# Patient Record
Sex: Male | Born: 1990 | Race: White | Hispanic: No | Marital: Single | Smoking: Current every day smoker
Health system: Southern US, Community
[De-identification: ages and names within clinical notes are randomized; demographics above are authoritative.]

## PROBLEM LIST (undated history)

## (undated) DIAGNOSIS — K279 Peptic ulcer, site unspecified, unspecified as acute or chronic, without hemorrhage or perforation: Secondary | ICD-10-CM

---

## 2016-09-06 ENCOUNTER — Emergency Department (HOSPITAL_COMMUNITY): Payer: Non-veteran care

## 2016-09-06 ENCOUNTER — Emergency Department (HOSPITAL_COMMUNITY)
Admission: EM | Admit: 2016-09-06 | Discharge: 2016-09-06 | Disposition: A | Discharge status: Home / Self Care | Payer: Non-veteran care | Attending: Emergency Medicine | Admitting: Emergency Medicine

## 2016-09-06 ENCOUNTER — Encounter (HOSPITAL_COMMUNITY): Payer: Self-pay | Admitting: *Deleted

## 2016-09-06 DIAGNOSIS — R21 Rash and other nonspecific skin eruption: Secondary | ICD-10-CM | POA: Diagnosis not present

## 2016-09-06 DIAGNOSIS — N50811 Right testicular pain: Secondary | ICD-10-CM

## 2016-09-06 DIAGNOSIS — Z202 Contact with and (suspected) exposure to infections with a predominantly sexual mode of transmission: Secondary | ICD-10-CM | POA: Diagnosis not present

## 2016-09-06 DIAGNOSIS — R6889 Other general symptoms and signs: Secondary | ICD-10-CM

## 2016-09-06 DIAGNOSIS — R05 Cough: Secondary | ICD-10-CM | POA: Diagnosis not present

## 2016-09-06 DIAGNOSIS — R1084 Generalized abdominal pain: Secondary | ICD-10-CM | POA: Diagnosis present

## 2016-09-06 DIAGNOSIS — F17200 Nicotine dependence, unspecified, uncomplicated: Secondary | ICD-10-CM | POA: Insufficient documentation

## 2016-09-06 DIAGNOSIS — R059 Cough, unspecified: Secondary | ICD-10-CM

## 2016-09-06 HISTORY — DX: Peptic ulcer, site unspecified, unspecified as acute or chronic, without hemorrhage or perforation: K27.9

## 2016-09-06 LAB — COMPREHENSIVE METABOLIC PANEL
ALBUMIN: 4.3 g/dL (ref 3.5–5.0)
ALT: 20 U/L (ref 17–63)
AST: 25 U/L (ref 15–41)
Alkaline Phosphatase: 70 U/L (ref 38–126)
Anion gap: 6 (ref 5–15)
BILIRUBIN TOTAL: 0.7 mg/dL (ref 0.3–1.2)
BUN: 8 mg/dL (ref 6–20)
CO2: 23 mmol/L (ref 22–32)
CREATININE: 1.11 mg/dL (ref 0.61–1.24)
Calcium: 9 mg/dL (ref 8.9–10.3)
Chloride: 106 mmol/L (ref 101–111)
GFR calc Af Amer: >60 mL/min (ref >60)
GFR calc non Af Amer: >60 mL/min (ref >60)
GLUCOSE: 100 mg/dL — AB (ref 65–99)
POTASSIUM: 4.2 mmol/L (ref 3.5–5.1)
Sodium: 135 mmol/L (ref 135–145)
TOTAL PROTEIN: 7.1 g/dL (ref 6.5–8.1)

## 2016-09-06 LAB — URINALYSIS, ROUTINE W REFLEX MICROSCOPIC
BILIRUBIN URINE: NEGATIVE
Glucose, UA: NEGATIVE mg/dL
Hgb urine dipstick: NEGATIVE
KETONES UR: NEGATIVE mg/dL
Leukocytes, UA: NEGATIVE
NITRITE: NEGATIVE
PH: 7 (ref 5.0–8.0)
Protein, ur: NEGATIVE mg/dL
Specific Gravity, Urine: 1.018 (ref 1.005–1.030)

## 2016-09-06 LAB — CBC
HEMATOCRIT: 42.3 % (ref 39.0–52.0)
Hemoglobin: 14.7 g/dL (ref 13.0–17.0)
MCH: 30.2 pg (ref 26.0–34.0)
MCHC: 34.8 g/dL (ref 30.0–36.0)
MCV: 87 fL (ref 78.0–100.0)
Platelets: 153 10*3/uL (ref 150–400)
RBC: 4.86 MIL/uL (ref 4.22–5.81)
RDW: 12.5 % (ref 11.5–15.5)
WBC: 6.6 10*3/uL (ref 4.0–10.5)

## 2016-09-06 LAB — LIPASE, BLOOD: Lipase: 21 U/L (ref 11–51)

## 2016-09-06 MED ORDER — TRIAMCINOLONE ACETONIDE 0.1 % EX CREA: 1.0000 "application " | TOPICAL_CREAM | Freq: Two times a day (BID) | CUTANEOUS | 0 refills | Status: AC

## 2016-09-06 MED ORDER — AZITHROMYCIN 250 MG PO TABS
1000.0000 mg | ORAL_TABLET | Freq: Once | ORAL | Status: AC
  Administered 2016-09-06: 1000 mg via ORAL
  Filled 2016-09-06: qty 4

## 2016-09-06 MED ORDER — LIDOCAINE HCL (PF) 1 % IJ SOLN
INTRAMUSCULAR | Status: AC
  Administered 2016-09-06: 5 mL
  Filled 2016-09-06: qty 5

## 2016-09-06 MED ORDER — CEFTRIAXONE SODIUM 1 G IJ SOLR
1.0000 g | INTRAMUSCULAR | Status: DC
  Administered 2016-09-06: 1 g via INTRAMUSCULAR
  Filled 2016-09-06: qty 10

## 2016-09-06 NOTE — ED Notes (Signed)
Patient transported to Ultrasound 

## 2016-09-06 NOTE — Discharge Instructions (Signed)
As we discussed, your ultrasound did show a cyst in your testicle but this is benign.  You were treated for gonorrhea and chlamydia, your cultures should come back in the next few days and we will call you if they are abnormal. Follow-up with urology if any ongoing issues with testicle pain, swelling, etc. Use kenalog for rash. Return to the ED for new or worsening symptoms.

## 2016-09-06 NOTE — ED Triage Notes (Signed)
Pt states that he has abdominal pain, cough/congestion, fatigue since the weekend. Pt states that he also needs to be checked for gonorrhea.

## 2016-09-06 NOTE — ED Notes (Signed)
Pt reports that his ex has an std, denies any symptoms

## 2016-09-06 NOTE — ED Provider Notes (Signed)
[facility]-EMERGENCY DEPT Provider Note   CSN: [id number] Arrival date & time: 09/06/16  1318  By signing my name below, I, Rosario Adie, attest that this documentation has been prepared under the direction and in the presence of Sharilyn Sites, PA-C.  Electronically Signed: Rosario Adie, ED Scribe. 09/06/16. 4:23 PM.  History   Chief Complaint Chief Complaint  Patient presents with  . Abdominal Pain   The history is provided by the patient. No language interpreter was used.    HPI Comments: Donald Young is a 26 y.o. male who presents to the Emergency Department complaining of persistent generalized abdominal pain beginning 3-4 days ago. He mentions that over the past several days since the onset of his abdominal pain that he has also experience some cough, congestion, and mild fatigue. No noted treatments for his symptoms were tried prior to coming into the ED. He has been able to tolerate PO intake since the onset of his symptoms. No nausea, diarrhea, vomiting, loss of appetite, fever.  He is also c/o a rash to the b/l hand and feet. He reports that he recently changed soaps just prior to the onset of his rash (was using old spice, now using dove).  state rash is pruritic. No fever, trouble swallowing, shortness of breath.   He additionally mentions that one of his recent sexual partners called him recently and notified him that she tested positive for Gonorrhea. He is requesting testing for this while in the ED. Also, he reports that he was recently treated for epididymitis which he was on a course of Doxycycline for. He reports that he was originally dx'd with this one year ago; however, following finishing treatment he has had intermittent and mild pain/swelling to the right testicle since. He did not have an US of the area to confirm this, per pt. He states that this pain is typically worse with movement and activity. No penile discharge, dysuria, hematuria, or other associated  GU symptoms.  Past Medical History:  Diagnosis Date  . Peptic ulcer    There are no active problems to display for this patient.  History reviewed. No pertinent surgical history.  Home Medications    Prior to Admission medications   Not on File   Family History No family history on file.  Social History Social History  Substance Use Topics  . Smoking status: Current Every Day Smoker  . Smokeless tobacco: Not on file  . Alcohol use Not on file   Allergies   Patient has no known allergies.  Review of Systems Review of Systems  Constitutional: Negative for appetite change and fever.  HENT: Positive for congestion. Negative for trouble swallowing.   Respiratory: Positive for cough. Negative for shortness of breath.   Gastrointestinal: Positive for abdominal pain. Negative for diarrhea, nausea and vomiting.  Genitourinary: Positive for scrotal swelling and testicular pain. Negative for dysuria and hematuria.  Skin: Positive for rash.  All other systems reviewed and are negative.  Physical Exam Updated Vital Signs BP 126/76 (BP Location: Left Arm)   Pulse 90   Temp 98 F (36.7 C) (Oral)   Resp 20   SpO2 97%   Physical Exam  Constitutional: He appears well-developed and well-nourished. No distress.  HENT:  Head: Normocephalic and atraumatic.  Eyes: Conjunctivae are normal.  Neck: Normal range of motion.  Cardiovascular: Normal rate, regular rhythm and normal heart sounds.   No murmur heard. Pulmonary/Chest: Effort normal and breath sounds normal. No respiratory distress. He has  no wheezes.  Abdominal: Soft. Bowel sounds are normal. He exhibits no distension. There is no tenderness.  Genitourinary:  Genitourinary Comments: Chaperone present throughout entire exam. Right testicle with some mild tenderness along the medial aspect, there does feel to be a small cystic structure within but no discrete mass.   Musculoskeletal: Normal range of motion.  Neurological: He  is alert.  Skin: No pallor.  Maculopapular rash on dorsal hands and feet, concentrated in the web spaces. There are no lesions on the palms or soles. Area appears dry and pruritic.   Psychiatric: He has a normal mood and affect. His behavior is normal.  Nursing note and vitals reviewed.  ED Treatments / Results  DIAGNOSTIC STUDIES: Oxygen Saturation is 97% on RA, normal by my interpretation.   COORDINATION OF CARE: 4:23 PM-Discussed next steps with pt. Pt verbalized understanding and is agreeable with the plan.   Labs (all labs ordered are listed, but only abnormal results are displayed) Labs Reviewed  COMPREHENSIVE METABOLIC PANEL - Abnormal; Notable for the following:       Result Value   Glucose, Bld 100 (*)    All other components within normal limits  LIPASE, BLOOD  CBC  URINALYSIS, ROUTINE W REFLEX MICROSCOPIC   EKG  EKG Interpretation None      Radiology No results found.  Procedures Procedures   Medications Ordered in ED Medications  cefTRIAXone (ROCEPHIN) injection 1 g (not administered)  azithromycin (ZITHROMAX) tablet 1,000 mg (not administered)   Initial Impression / Assessment and Plan / ED Course  I have reviewed the triage vital signs and the nursing notes.  Pertinent labs & imaging results that were available during my care of the patient were reviewed by me and considered in my medical decision making (see chart for details).  26 year old male here with multiple complaints.  1.  Abdominal pain. Has been ongoing for a few days. Reports associated cough, nasal congestion, mild fatigue. Has been tolerating oral food and fluids well. Abdomen is soft and benign here. Labs overall reassuring. Lungs clear without wheezes or rhonchi to suggest pneumonia.  Suspect this may be a viral process.  Continue supportive care.  2.  Rash.  Localized to dorsal hands and feet, concentrated in the web spaces. No apparent bug bites. Rash occurred after changing soap.  Appears to be a contact dermatitis. No lesions on palms/soles suggest of syphilis.  Will start on Kenalog.  3.  Exposure to gonorrhea. Is not having any discharge but has been having some testicle pain. Reported he was told he had epididymitis but never had ultrasound. On genital exam there is a small cystic structure noted in the right testicle but no discrete mass. Ultrasound was obtained revealing tubular ectasia up the epididymitis, more pronounced on right. UA without signs of infection. Gonorrhea and Chlamydia cultures pending. Patient treated empirically here with Rocephin and azithromycin. Will get follow-up with urology if any ongoing issues.  Discussed plan with patient, he acknowledged understanding and agreed with plan of care.  Return precautions given for new or worsening symptoms.  Final Clinical Impressions(s) / ED Diagnoses   Final diagnoses:  Pain in right testicle  Exposure to gonorrhea  Cough  Rash  Multiple complaints   New Prescriptions Discharge Medication List as of 09/06/2016  6:24 PM    START taking these medications   Details  triamcinolone cream (KENALOG) 0.1 % Apply 1 application topically 2 (two) times daily., Starting Tue 09/06/2016, Print  I personally performed the services described in this documentation, which was scribed in my presence. The recorded information has been reviewed and is accurate.    Garlon HatchetSanders, Abagael Kramm M, PA-C 09/06/16 1901    Tilden Fossaees, Elizabeth, MD 09/07/16 1038

## 2016-09-06 NOTE — ED Notes (Signed)
Patient returned from ultrasound.

## 2018-08-08 IMAGING — US US ART/VEN ABD/PELV/SCROTUM DOPPLER LTD
1 series · 13 of 25 positions shown · non-contrast
Comparison: None.

CLINICAL DATA: RIGHT testicular pain for 1 month

EXAM:
SCROTAL ULTRASOUND
DOPPLER ULTRASOUND OF THE TESTICLES
TECHNIQUE: Complete ultrasound examination of the testicles, epididymis, and
other scrotal structures was performed. Color and spectral Doppler
ultrasound were also utilized to evaluate blood flow to the
testicles.

[Series 1: us art/ven abd/pelv/scrotum doppler ltd · 0.07mm/px · 13 of 70 slices shown]
[im 1/70]
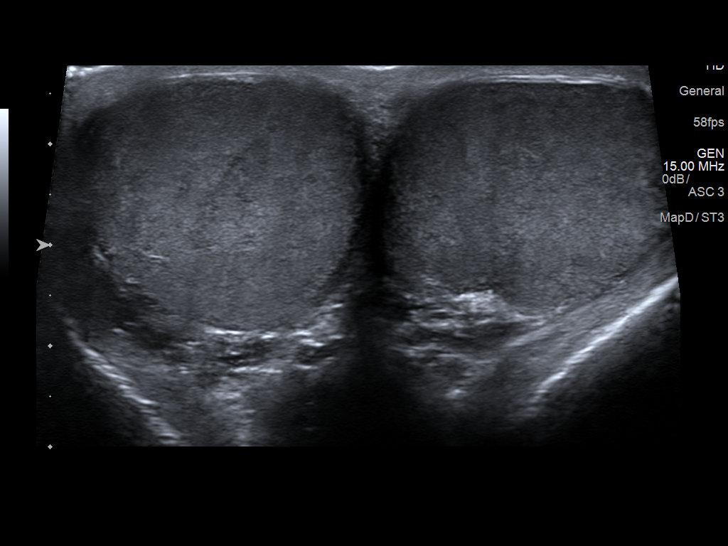
[im 6/70]
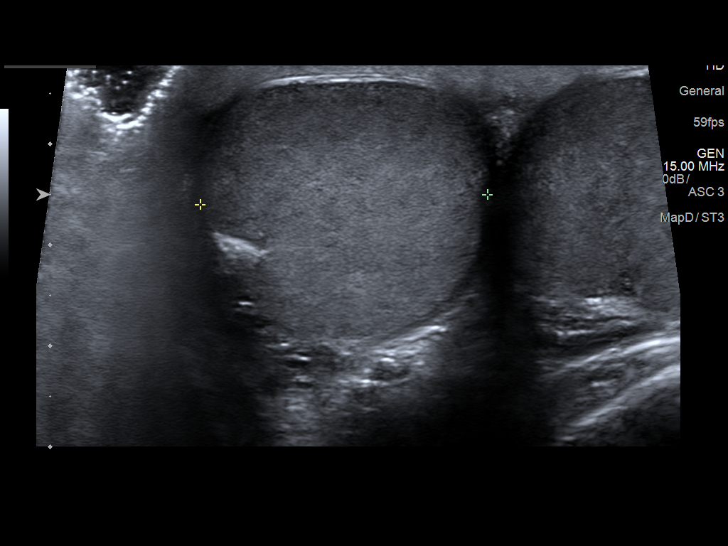
[im 12/70]
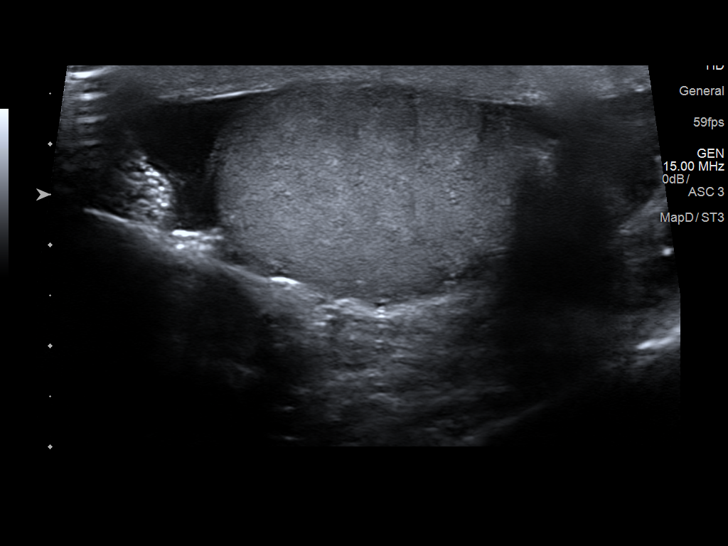
[im 18/70]
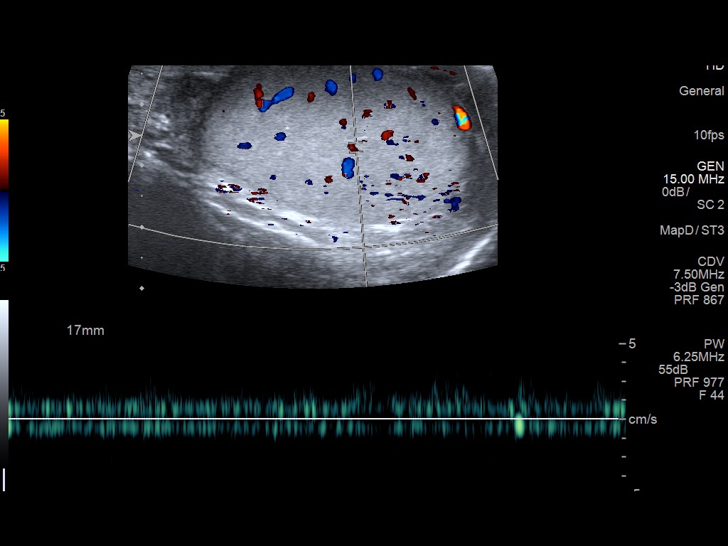
[im 24/70]
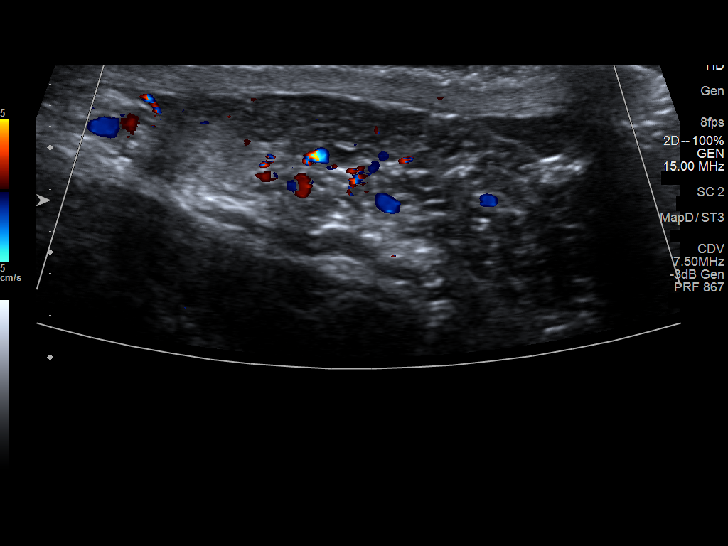
[im 29/70]
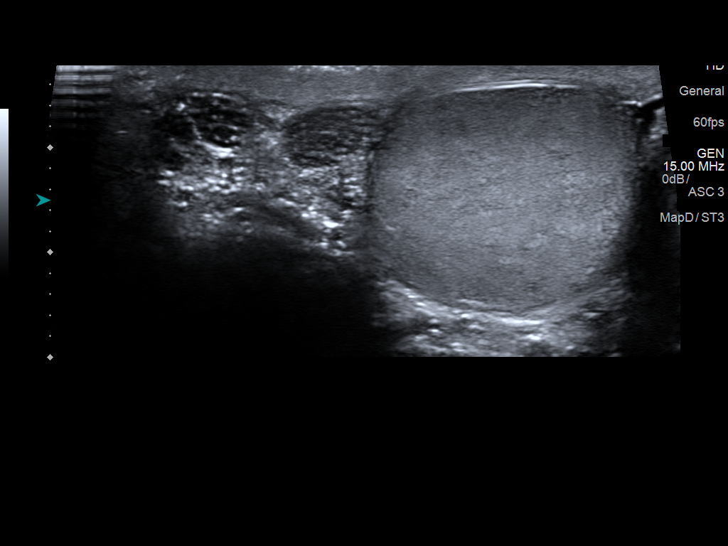
[im 35/70]
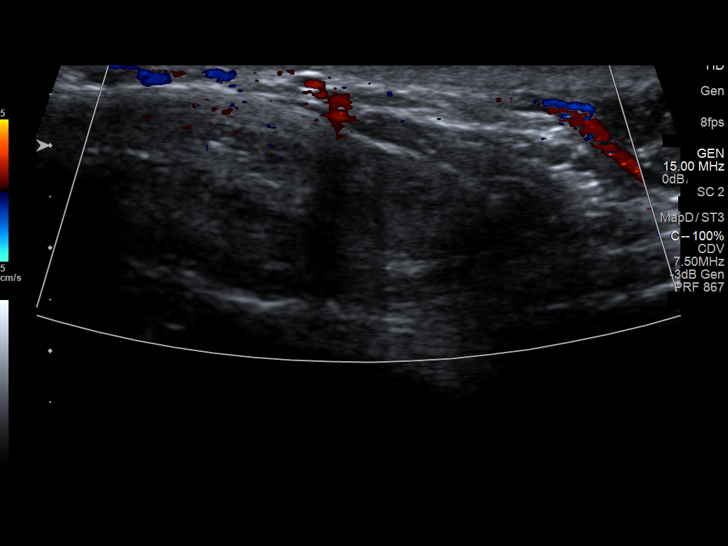
[im 41/70]
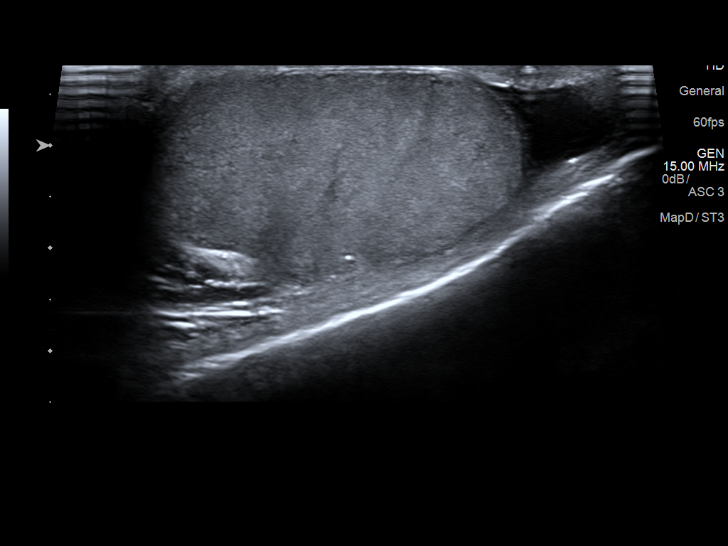
[im 47/70]
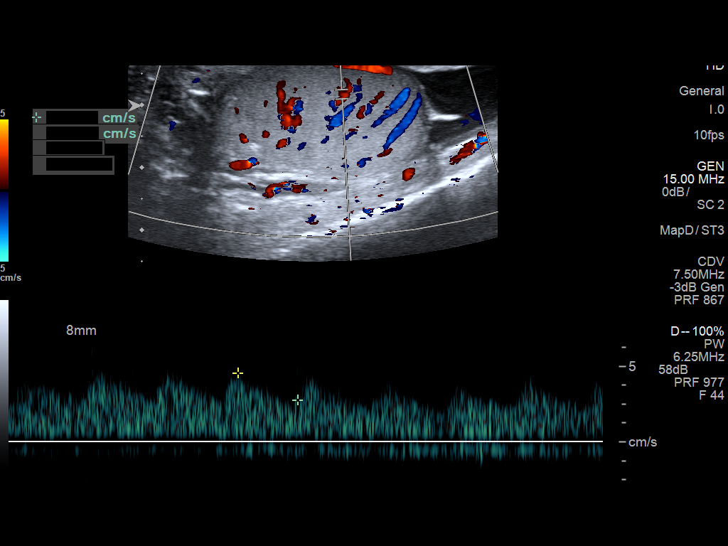
[im 52/70]
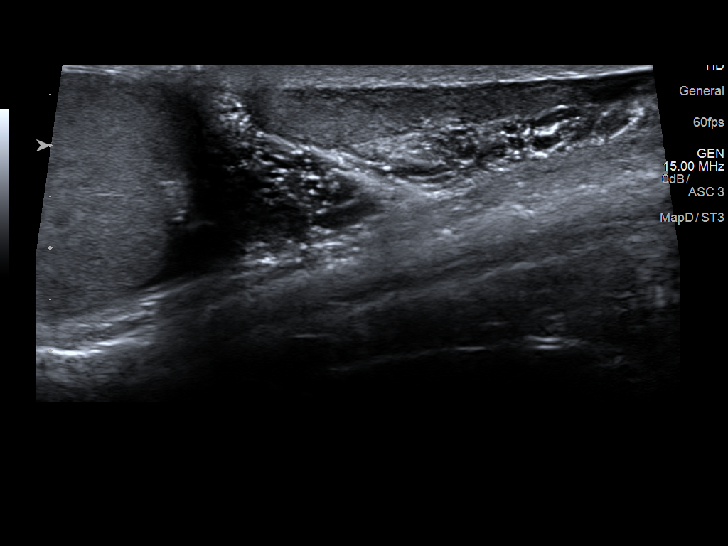
[im 58/70]
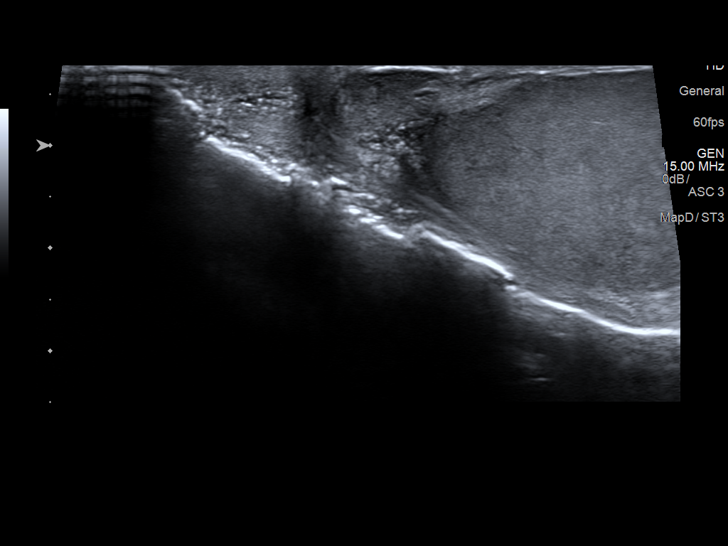
[im 64/70]
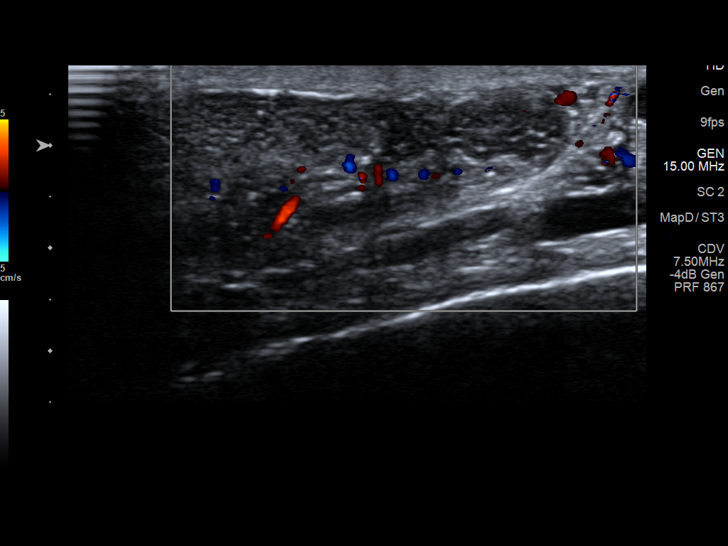
[im 70/70]
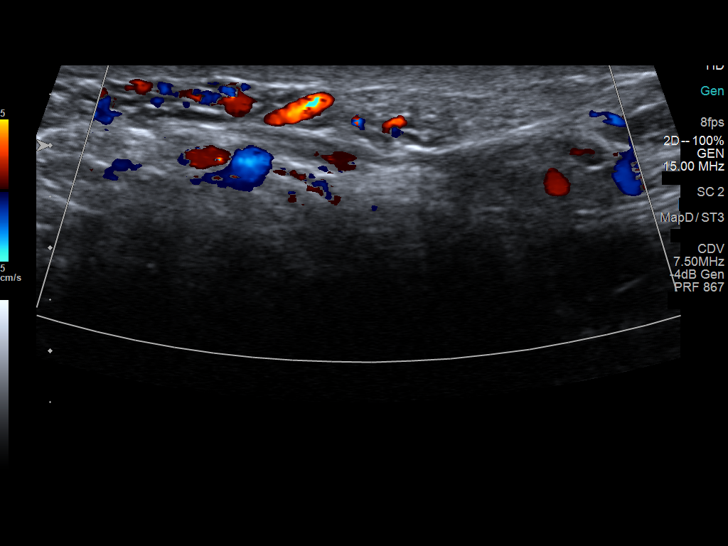

[13 of 25 positions shown; findings below may reference images not displayed]

FINDINGS: Right testicle

Measurements: 4.7 x 2.4 x 2.8 cm. Normal morphology without mass or
calcification. Internal blood flow present on color Doppler imaging.

Left testicle

Measurements: 3.6 x 2.0 x 3.1 cm. Normal morphology without mass or
calcification. Internal blood flow present on color Doppler imaging,
symmetric with RIGHT.

Right epididymis: Enlarged and heterogeneous with appearance
consistent with tubular ectasia. No discrete mass.

Left epididymis: Less enlarged but with areas of heterogeneity,
appearance consistent with tubular ectasia. No discrete mass.

Hydrocele:  Minimal RIGHT and small LEFT hydroceles

Varicocele:  Absent bilaterally

Pulsed Doppler interrogation of both testes demonstrates normal low
resistance arterial and venous waveforms bilaterally.
IMPRESSION: Unremarkable testes without evidence of mass, torsion or hyperemic
process.

Tubular ectasia of the epididymi bilaterally, more pronounced on
RIGHT.
